# Patient Record
Sex: Female | Born: 1955 | Race: Black or African American | Hispanic: No | Marital: Single | Smoking: Current every day smoker
Health system: Southern US, Community
[De-identification: ages and names within clinical notes are randomized; demographics above are authoritative.]

## PROBLEM LIST (undated history)

## (undated) DIAGNOSIS — K219 Gastro-esophageal reflux disease without esophagitis: Secondary | ICD-10-CM

## (undated) DIAGNOSIS — IMO0001 Reserved for inherently not codable concepts without codable children: Secondary | ICD-10-CM

## (undated) DIAGNOSIS — J449 Chronic obstructive pulmonary disease, unspecified: Secondary | ICD-10-CM

## (undated) DIAGNOSIS — I10 Essential (primary) hypertension: Secondary | ICD-10-CM

## (undated) DIAGNOSIS — R110 Nausea: Secondary | ICD-10-CM

---

## 2004-07-28 ENCOUNTER — Ambulatory Visit: Payer: Self-pay | Admitting: Family Medicine

## 2004-08-11 ENCOUNTER — Ambulatory Visit: Payer: Self-pay | Admitting: Family Medicine

## 2007-11-05 ENCOUNTER — Ambulatory Visit (HOSPITAL_COMMUNITY): Admission: RE | Admit: 2007-11-05 | Discharge: 2007-11-05 | Payer: Self-pay | Admitting: Family Medicine

## 2009-01-04 ENCOUNTER — Ambulatory Visit: Payer: Self-pay | Admitting: Cardiology

## 2011-08-31 DIAGNOSIS — R000 Tachycardia, unspecified: Secondary | ICD-10-CM

## 2011-09-02 DIAGNOSIS — R000 Tachycardia, unspecified: Secondary | ICD-10-CM

## 2013-02-06 ENCOUNTER — Encounter (HOSPITAL_COMMUNITY): Payer: Self-pay | Admitting: Emergency Medicine

## 2013-02-06 ENCOUNTER — Inpatient Hospital Stay (HOSPITAL_COMMUNITY)
Admission: EM | Admit: 2013-02-06 | Discharge: 2013-02-08 | DRG: 684 | Disposition: A | Discharge status: Home / Self Care | Payer: PRIVATE HEALTH INSURANCE | Attending: Family Medicine | Admitting: Family Medicine

## 2013-02-06 DIAGNOSIS — R197 Diarrhea, unspecified: Secondary | ICD-10-CM | POA: Diagnosis present

## 2013-02-06 DIAGNOSIS — E876 Hypokalemia: Secondary | ICD-10-CM | POA: Diagnosis present

## 2013-02-06 DIAGNOSIS — E860 Dehydration: Secondary | ICD-10-CM | POA: Diagnosis present

## 2013-02-06 DIAGNOSIS — I10 Essential (primary) hypertension: Secondary | ICD-10-CM | POA: Diagnosis present

## 2013-02-06 DIAGNOSIS — N179 Acute kidney failure, unspecified: Principal | ICD-10-CM | POA: Diagnosis present

## 2013-02-06 DIAGNOSIS — I498 Other specified cardiac arrhythmias: Secondary | ICD-10-CM | POA: Diagnosis present

## 2013-02-06 DIAGNOSIS — R55 Syncope and collapse: Secondary | ICD-10-CM

## 2013-02-06 DIAGNOSIS — F17200 Nicotine dependence, unspecified, uncomplicated: Secondary | ICD-10-CM | POA: Diagnosis present

## 2013-02-06 DIAGNOSIS — I959 Hypotension, unspecified: Secondary | ICD-10-CM

## 2013-02-06 DIAGNOSIS — K219 Gastro-esophageal reflux disease without esophagitis: Secondary | ICD-10-CM | POA: Diagnosis present

## 2013-02-06 HISTORY — DX: Essential (primary) hypertension: I10

## 2013-02-06 HISTORY — DX: Reserved for inherently not codable concepts without codable children: IMO0001

## 2013-02-06 HISTORY — DX: Gastro-esophageal reflux disease without esophagitis: K21.9

## 2013-02-06 HISTORY — DX: Nausea: R11.0

## 2013-02-06 LAB — CBC WITH DIFFERENTIAL/PLATELET
HCT: 34.6 % — ABNORMAL LOW (ref 36.0–46.0)
Hemoglobin: 11.6 g/dL — ABNORMAL LOW (ref 12.0–15.0)
Lymphs Abs: 1.2 10*3/uL (ref 0.7–4.0)
MCH: 28.6 pg (ref 26.0–34.0)
Monocytes Relative: 12 % (ref 3–12)
Neutro Abs: 3.4 10*3/uL (ref 1.7–7.7)
Neutrophils Relative %: 63 % (ref 43–77)
RBC: 4.06 MIL/uL (ref 3.87–5.11)

## 2013-02-06 LAB — BASIC METABOLIC PANEL
BUN: 50 mg/dL — ABNORMAL HIGH (ref 6–23)
Chloride: 97 mEq/L (ref 96–112)
Glucose, Bld: 124 mg/dL — ABNORMAL HIGH (ref 70–99)
Potassium: 2.3 mEq/L — CL (ref 3.5–5.1)

## 2013-02-06 MED ORDER — POTASSIUM CHLORIDE 10 MEQ/100ML IV SOLN
10.0000 meq | INTRAVENOUS | Status: DC
  Administered 2013-02-06: 10 meq via INTRAVENOUS
  Filled 2013-02-06: qty 100

## 2013-02-06 MED ORDER — FAMOTIDINE IN NACL 20-0.9 MG/50ML-% IV SOLN
20.0000 mg | Freq: Two times a day (BID) | INTRAVENOUS | Status: DC
  Administered 2013-02-06: 20 mg via INTRAVENOUS
  Filled 2013-02-06 (×6): qty 50

## 2013-02-06 MED ORDER — FAMOTIDINE IN NACL 20-0.9 MG/50ML-% IV SOLN
INTRAVENOUS | Status: AC
  Filled 2013-02-06: qty 50

## 2013-02-06 MED ORDER — SODIUM CHLORIDE 0.9 % IV BOLUS (SEPSIS)
2000.0000 mL | Freq: Once | INTRAVENOUS | Status: AC
  Administered 2013-02-06: 2000 mL via INTRAVENOUS

## 2013-02-06 MED ORDER — ONDANSETRON HCL 4 MG/2ML IJ SOLN: 4.0000 mg | Freq: Four times a day (QID) | INTRAMUSCULAR | Status: DC | PRN

## 2013-02-06 MED ORDER — POTASSIUM CHLORIDE 10 MEQ/100ML IV SOLN
10.0000 meq | INTRAVENOUS | Status: AC
  Administered 2013-02-06 – 2013-02-07 (×6): 10 meq via INTRAVENOUS
  Filled 2013-02-06 (×6): qty 100

## 2013-02-06 MED ORDER — INFLUENZA VAC SPLIT QUAD 0.5 ML IM SUSP
0.5000 mL | INTRAMUSCULAR | Status: AC
  Administered 2013-02-07: 0.5 mL via INTRAMUSCULAR
  Filled 2013-02-06: qty 0.5

## 2013-02-06 MED ORDER — PNEUMOCOCCAL VAC POLYVALENT 25 MCG/0.5ML IJ INJ
0.5000 mL | INJECTION | INTRAMUSCULAR | Status: AC
  Administered 2013-02-07: 0.5 mL via INTRAMUSCULAR
  Filled 2013-02-06: qty 0.5

## 2013-02-06 MED ORDER — SODIUM CHLORIDE 0.9 % IJ SOLN
3.0000 mL | Freq: Two times a day (BID) | INTRAMUSCULAR | Status: DC
  Administered 2013-02-06: 3 mL via INTRAVENOUS

## 2013-02-06 MED ORDER — ENOXAPARIN SODIUM 30 MG/0.3ML ~~LOC~~ SOLN
30.0000 mg | SUBCUTANEOUS | Status: DC
  Administered 2013-02-06: 30 mg via SUBCUTANEOUS
  Filled 2013-02-06: qty 0.3

## 2013-02-06 MED ORDER — DIPHENOXYLATE-ATROPINE 2.5-0.025 MG PO TABS
2.0000 | ORAL_TABLET | Freq: Once | ORAL | Status: AC
  Administered 2013-02-06: 2 via ORAL
  Filled 2013-02-06: qty 2

## 2013-02-06 MED ORDER — ACETAMINOPHEN 325 MG PO TABS
650.0000 mg | ORAL_TABLET | Freq: Four times a day (QID) | ORAL | Status: DC | PRN
Start: 2013-02-06 — End: 2013-02-08

## 2013-02-06 MED ORDER — ONDANSETRON HCL 4 MG PO TABS: 4.0000 mg | ORAL_TABLET | Freq: Four times a day (QID) | ORAL | Status: DC | PRN

## 2013-02-06 MED ORDER — SODIUM CHLORIDE 0.9 % IV SOLN
INTRAVENOUS | Status: DC
  Administered 2013-02-06 – 2013-02-07 (×3): via INTRAVENOUS

## 2013-02-06 MED ORDER — ACETAMINOPHEN 650 MG RE SUPP: 650.0000 mg | Freq: Four times a day (QID) | RECTAL | Status: DC | PRN

## 2013-02-06 NOTE — ED Notes (Signed)
Lab called critical K 2.3.  Notified edp

## 2013-02-06 NOTE — ED Notes (Signed)
CBG en route was 111. Pt received zofran 4mg  IV en route.

## 2013-02-06 NOTE — ED Notes (Signed)
Pt passed out while standing in the post office. Sick since Tuesday with diarrhea and decreased appetite.

## 2013-02-06 NOTE — Progress Notes (Signed)
Daphane Shepherd is notified about pt's low HR, mid 40s, SB, BP 90/40. Pt denies any pain or discomfort. Orders are received and followed.

## 2013-02-06 NOTE — ED Provider Notes (Signed)
CSN: [id number]     Arrival date & time 02/06/13  1318 History   First MD Initiated Contact with Patient 02/06/13 1342     Chief Complaint  Patient presents with  . Loss of Consciousness   ) HPI Pt has no PCP.  She gets her BP meds refilled at [facility].  Patient seen at 1348 pm.  She presents via EMS in a cervical collar and long spine board after syncopal episode. She's had diarrhea 7-8 times per day since Tuesday, 2 days before that. She was standing at the post office today. She started feeling weak and dizzy. She got up the counter and was feeling more lightheaded.  Denies vertigo.She slid to the floor.. She did not strike her head that she can recall. She was out only a few seconds. Did not have incontinence. Did not have seizure activity or myoclonus. She remembers feeling like she was going to pass out, and feeling warm. She denies chest pain or palpitations.  No vomiting. No blood in her stools. No recent antibiotic use. She is nondiabetic. She has history of hypertension. She did take her antihypertensives.   Past Medical History  Diagnosis Date  . Hypertension   . Reflux   . Nausea    History reviewed. No pertinent past surgical history. No family history on file. History  Substance Use Topics  . Smoking status: Current Every Day Smoker    Types: Cigarettes  . Smokeless tobacco: Not on file  . Alcohol Use: Yes     Comment: Occ   OB History   Grav Para Term Preterm Abortions TAB SAB Ect Mult Living                 Review of Systems  Constitutional: Negative for fever, chills, diaphoresis, appetite change and fatigue.  HENT: Negative for mouth sores, sore throat and trouble swallowing.   Eyes: Negative for visual disturbance.  Respiratory: Negative for cough, chest tightness, shortness of breath and wheezing.   Cardiovascular: Negative for chest pain.  Gastrointestinal: Positive for diarrhea. Negative for nausea, vomiting, abdominal pain and  abdominal distention.  Endocrine: Negative for polydipsia, polyphagia and polyuria.  Genitourinary: Negative for dysuria, frequency and hematuria.  Musculoskeletal: Negative for gait problem.  Skin: Negative for color change, pallor and rash.  Neurological: Positive for syncope. Negative for dizziness, light-headedness and headaches.  Hematological: Does not bruise/bleed easily.  Psychiatric/Behavioral: Negative for behavioral problems and confusion.    Allergies  Penicillins  Home Medications   Current Outpatient Rx  Name  Route  Sig  Dispense  Refill  . Aspirin-Acetaminophen-Caffeine (GOODY HEADACHE PO)   Oral   Take 1 packet by mouth daily as needed (headache).         . lisinopril-hydrochlorothiazide (PRINZIDE,ZESTORETIC) 20-12.5 MG per tablet   Oral   Take 1 tablet by mouth daily.         . promethazine (PHENERGAN) 25 MG tablet   Oral   Take 12.5-25 mg by mouth as needed for nausea or vomiting (every 4-6 hours as needed.).         Marland Kitchen ranitidine (ZANTAC) 150 MG tablet   Oral   Take 150 mg by mouth 2 (two) times daily.          BP 98/64  Pulse 54  Temp(Src) 97.7 F (36.5 C) (Oral)  Resp 16  Ht 5\' 3"  (1.6 m)  Wt 179 lb (81.194 kg)  BMI 31.72 kg/m2  SpO2 100% Physical Exam  Constitutional: She is oriented to person, place, and time. She appears well-developed and well-nourished. No distress. Cervical collar and backboard in place.  Patient awake alert oriented. Nontender over the scalp and skull. No signs of trauma the head. Nontender the midline cervical spine. Nontender over the thoracic lumbar spine. Clinically cleared from cervical: Spine board upon arrival.  HENT:  Head: Normocephalic.  Eyes: Conjunctivae are normal. Pupils are equal, round, and reactive to light. No scleral icterus.  Neck: Normal range of motion. Neck supple. No thyromegaly present.  Cardiovascular: Normal rate and regular rhythm.  Exam reveals no gallop and no friction rub.   No  murmur heard. Pulmonary/Chest: Effort normal and breath sounds normal. No respiratory distress. She has no wheezes. She has no rales.  Abdominal: Soft. Bowel sounds are normal. She exhibits no distension. There is no tenderness. There is no rebound.  Musculoskeletal: Normal range of motion.  Neurological: She is alert and oriented to person, place, and time.  Skin: Skin is warm and dry. No rash noted.  Psychiatric: She has a normal mood and affect. Her behavior is normal.    ED Course  Procedures (including critical care time) Labs Review Labs Reviewed  CBC WITH DIFFERENTIAL - Abnormal; Notable for the following:    Hemoglobin 11.6 (*)    HCT 34.6 (*)    All other components within normal limits  BASIC METABOLIC PANEL   Imaging Review No results found.  EKG Interpretation     Ventricular Rate:  51 PR Interval:  160 QRS Duration: 104 QT Interval:  486 QTC Calculation: 447 R Axis:   -29 Text Interpretation:  Sinus Rhythm  with sinus arrhythmia No previous ECGs available            MDM   1. Diarrhea   2. Dehydration   3. Hypotension      Medical decision making, ED course:  No headache. Nontender in the head. Able to be clinically cleared from the cervical collar and long spine board upon arrival. No neurological symptoms. She is somewhat hypotensive with pressure of 96 systolic. Plan will be IV fluids will check her electrolytes and CBC. She does not appear anemic. She's not tachycardic. She is awake alert oriented and mentating well.  After first l NS pt c/o felling weak.  SBP still 90s systolic. Starting 2nd L NS.  Unfortunately, Pt took BP meds just before leaving to go to [employer].  Normal Hb.    BP 89/67, starting 2nd l NS.  Labs show K+ of 2.3, Azotemia with Cr. 2.3.  ? Baseline.  No h/o Renal Insufficiency per pt report.  No comparison labs.  Mg level requested.      Roney Marion, MD 02/06/13 [phone]

## 2013-02-06 NOTE — H&P (Signed)
History and Physical  DECHELLE ATTAWAY UJW:[id number] DOB: [birth year] DOA: 02/06/2013  Referring physician: Rolland Porter, MD in ED PCP: Provider Not In System  [facility] free clinic  Chief Complaint: Verna Czech out  HPI:  57 year old woman presented to the emergency department after a syncopal episode. Initial evaluation was notable for hypotension, profound hypokalemia and acute renal failure.  Patient reports diarrhea began suddenly 11/11 with multiple bouts, nonbloody. She also had whole-body muscle aches. She spent most of the day and then following day in bed. She had no nausea, vomiting or abdominal pain. Appetite was poor. No nasal congestion, runny nose, cough or upper respiratory symptoms. She went to the emergency department at [facility] 11/12 but left without being seen. She is seen by her primary care provider the following day without definitive diagnosis. She had severe diarrhea 11/11 and 11/12. Yesterday 11/13 the diarrhea slowed down after she took Imodium and Pepto-Bismol. She has had no diarrhea today. She has had absolutely no nausea, vomiting or abdominal pain. Went to the bank, was standing in line with she became lightheaded, dizzy and passed out. Afterwards she had a mild headache. Currently she has no musculoskeletal complaints.  In the emergency department noted to be afebrile. Hypotensive. Potassium was 2.3. BUN 50, creatinine 2.34. White blood cell count normal. EKG not acute.  Review of Systems:  Negative for fever, visual changes, sore throat, rash, chest pain, SOB, dysuria, bleeding, n/v, abdominal pain.  Positive for chills.  Past Medical History  Diagnosis Date  . Hypertension   . Reflux   . Nausea     History reviewed. No pertinent past surgical history. No previous surgery.  Social History:  reports that she has been smoking Cigarettes.  She has been smoking about 0.00 packs per day. She does not have any smokeless tobacco history on file. She reports that  she drinks alcohol. She reports that she does not use illicit drugs.  Allergies  Allergen Reactions  . Penicillins Hives    Family History  Problem Relation Age of Onset  . Cancer Brother     lung     Prior to Admission medications   Medication Sig Start Date End Date Taking? Authorizing Provider  Aspirin-Acetaminophen-Caffeine (GOODY HEADACHE PO) Take 1 packet by mouth daily as needed (headache).   Yes Historical Provider, MD  lisinopril-hydrochlorothiazide (PRINZIDE,ZESTORETIC) 20-12.5 MG per tablet Take 1 tablet by mouth daily.   Yes Historical Provider, MD  promethazine (PHENERGAN) 25 MG tablet Take 12.5-25 mg by mouth as needed for nausea or vomiting (every 4-6 hours as needed.).   Yes Historical Provider, MD  ranitidine (ZANTAC) 150 MG tablet Take 150 mg by mouth 2 (two) times daily.   Yes Historical Provider, MD   Physical Exam: Filed Vitals:   02/06/13 1322  BP: 98/64  Pulse: 54  Temp: 97.7 F (36.5 C)  TempSrc: Oral  Resp: 16  Height: 5\' 3"  (1.6 m)  Weight: 81.194 kg (179 lb)  SpO2: 100%   General: Examined in the emergency department. Appears calm and comfortable. Appears well. Eyes: PERRL, normal lids, irises & conjunctiva ENT: grossly normal hearing, lips & tongue. Poor dentition with missing teeth. Neck: no LAD, masses or thyromegaly Cardiovascular: RRR, no m/r/g. No LE edema. Respiratory: CTA bilaterally, no w/r/r. Normal respiratory effort. Abdomen: soft, ntnd, positive bowel sounds. No guarding. Skin: no rash or induration seen  Musculoskeletal: grossly normal tone BUE/BLE; moves all extremities well. Psychiatric: grossly normal mood and affect, speech fluent and appropriate Neurologic: grossly  non-focal.  Wt Readings from Last 3 Encounters:  02/06/13 81.194 kg (179 lb)    Labs on Admission:  Basic Metabolic Panel:  Recent Labs Lab 02/06/13 1409  NA 137  K 2.3*  CL 97  CO2 29  GLUCOSE 124*  BUN 50*  CREATININE 2.34*  CALCIUM 8.9    CBC:  Recent Labs Lab 02/06/13 1409  WBC 5.3  NEUTROABS 3.4  HGB 11.6*  HCT 34.6*  MCV 85.2  PLT 186     EKG: Independently reviewed. Sinus rhythm. No acute changes.   Principal Problem:   Hypokalemia Active Problems:   Syncope   Hypotension   Acute renal failure   Hypertension   Assessment/Plan 1. Syncope: Presumably secondary to dehydration, hypokalemia. Monitor on telemetry. 2. Hypotension: history most suggestive of volume loss secondary to diarrhea complicated by continued use of antihypertensives. No signs or symptoms to suggest sepsis. Continue volume repletion. Check lactic acid. Check serum cortisol. 3. Profound hypokalemia: Magnesium normal. Replete aggressively. Presumably secondary to GI losses complicated by hydrochlorothiazide. 4. Acute renal failure: Baseline unknown. Presumed to be secondary to GI losses complicated by lisinopril, hydrochlorothiazide, NSAIDs. 5. Acute diarrheal illness: Abdominal exam benign and no pain. History consistent with viral illness. Last antibiotics several months ago. Check C. difficile. 6. Hypertension  Manual blood pressure in the 80s in the emergency department with the automatic blood pressure cuff during somewhat lower. Patient alert, conversant, nontoxic and appears asymptomatic. History most suggestive of benign etiology, sepsis is doubted. Further evaluation as above. Admit to step down.  Code Status: full code  DVT prophylaxis: Lovenox Family Communication: None present Disposition Plan/Anticipated LOS: admit 2 days  Time spent: 50 minutes  Brendia Sacks, MD  [facility] Pager [phone] 02/06/2013, 3:54 PM

## 2013-02-07 DIAGNOSIS — E876 Hypokalemia: Secondary | ICD-10-CM

## 2013-02-07 LAB — BASIC METABOLIC PANEL
BUN: 31 mg/dL — ABNORMAL HIGH (ref 6–23)
CO2: 25 mEq/L (ref 19–32)
Chloride: 104 mEq/L (ref 96–112)
GFR calc Af Amer: 65 mL/min — ABNORMAL LOW (ref >90)
Glucose, Bld: 109 mg/dL — ABNORMAL HIGH (ref 70–99)
Potassium: 2.9 mEq/L — ABNORMAL LOW (ref 3.5–5.1)
Sodium: 138 mEq/L (ref 135–145)

## 2013-02-07 LAB — CBC
HCT: 32 % — ABNORMAL LOW (ref 36.0–46.0)
Hemoglobin: 10.8 g/dL — ABNORMAL LOW (ref 12.0–15.0)
MCH: 29 pg (ref 26.0–34.0)
MCHC: 33.8 g/dL (ref 30.0–36.0)

## 2013-02-07 LAB — TROPONIN I: Troponin I: <0.3 ng/mL (ref <0.30)

## 2013-02-07 LAB — MAGNESIUM: Magnesium: 2.1 mg/dL (ref 1.5–2.5)

## 2013-02-07 LAB — CORTISOL: Cortisol, Plasma: 7.9 ug/dL

## 2013-02-07 MED ORDER — FAMOTIDINE 20 MG PO TABS
20.0000 mg | ORAL_TABLET | Freq: Two times a day (BID) | ORAL | Status: DC
  Administered 2013-02-07 – 2013-02-08 (×3): 20 mg via ORAL
  Filled 2013-02-07 (×3): qty 1

## 2013-02-07 MED ORDER — POTASSIUM CHLORIDE CRYS ER 20 MEQ PO TBCR
40.0000 meq | EXTENDED_RELEASE_TABLET | Freq: Four times a day (QID) | ORAL | Status: AC
  Administered 2013-02-07 (×4): 40 meq via ORAL
  Filled 2013-02-07 (×4): qty 2

## 2013-02-07 MED ORDER — ENOXAPARIN SODIUM 40 MG/0.4ML ~~LOC~~ SOLN
40.0000 mg | SUBCUTANEOUS | Status: DC
  Administered 2013-02-07: 40 mg via SUBCUTANEOUS
  Filled 2013-02-07: qty 0.4

## 2013-02-07 MED ORDER — SODIUM CHLORIDE 0.9 % IV SOLN
INTRAVENOUS | Status: DC
  Administered 2013-02-07 – 2013-02-08 (×3): via INTRAVENOUS

## 2013-02-07 MED ORDER — SODIUM CHLORIDE 0.9 % IV BOLUS (SEPSIS)
1000.0000 mL | Freq: Once | INTRAVENOUS | Status: AC
  Administered 2013-02-07: 1000 mL via INTRAVENOUS

## 2013-02-07 MED ORDER — POTASSIUM CHLORIDE 10 MEQ/100ML IV SOLN
10.0000 meq | INTRAVENOUS | Status: DC
  Administered 2013-02-07: 10 meq via INTRAVENOUS
  Filled 2013-02-07: qty 100

## 2013-02-07 MED ORDER — LORAZEPAM 1 MG PO TABS
1.0000 mg | ORAL_TABLET | Freq: Once | ORAL | Status: AC
  Administered 2013-02-08: 1 mg via ORAL
  Filled 2013-02-07: qty 1

## 2013-02-07 NOTE — Progress Notes (Signed)
[facility] PROGRESS NOTE  SHAWNI VOLKOV ZOX:[id number] DOB: [birth year] DOA: 02/06/2013 PCP: Provider Not In System [facility] free clinic  Assessment/Plan: 1. Syncope: Secondary to dehydration and profound hypokalemia. 2. Hypotension: Resolved with aggressive IV fluids. Secondary to diarrhea and GI losses. 3. Profound hypokalemia: Improving with repletion. Continue replacement therapy. Magnesium normal. 4. Acute renal failure:resolving rapidly with IV fluids. Adequate urine output. 5. Acute diarrheal illness: Appears to have resolved at this point. 6. Hypertension 7. Chest pain overnight has resolved. EKG unremarkable troponins negative. No further evaluation suggested at this point. Favor atypical etiology. 8. Bradycardia: Sinus. Asymptomatic. Not on any rate control agents. No pauses noted. Suspect physiologic. Monitor clinically.   Advance to a regular diet  Continue potassium replacement, IV fluids.  Basic metabolic panel in the morning  Transfer to medical floor  Anticipate discharge within 48 hours  Pending studies:   none  Code Status: full code DVT prophylaxis: Lovenox Family Communication: none present Disposition Plan: as above  Brendia Sacks, MD  [facility]  Pager [phone] If 7PM-7AM, please contact night-coverage at [email or web address], password [facility] 02/07/2013, 8:05 AM  LOS: 1 day   Summary: 57 year old woman presented to the emergency department after a syncopal episode. Initial evaluation was notable for hypotension, profound hypokalemia and acute renal failure.  Consultants:    Procedures:    Antibiotics:    HPI/Subjective: Noted to have low heart rate and borderline hypotension overnight. Had chest pain last night. Today she feels fine. No complaints. No nausea, vomiting or diarrhea. No abdominal pain. Very hungry.  Objective: Filed Vitals:   02/07/13 0300 02/07/13 0400 02/07/13 0500 02/07/13 0600  BP: 78/45 84/52  84/46 88/46  Pulse:      Temp:  97.6 F (36.4 C)    TempSrc:  Oral    Resp:  10 18   Height:      Weight:   81.738 kg (180 lb 3.2 oz)   SpO2:  95%      Intake/Output Summary (Last 24 hours) at 02/07/13 0805 Last data filed at 02/07/13 0711  Gross per 24 hour  Intake    975 ml  Output   1750 ml  Net   -775 ml     Filed Weights   02/06/13 1322 02/07/13 0500  Weight: 81.194 kg (179 lb) 81.738 kg (180 lb 3.2 oz)    Exam:   Afebrile. Heart rate in the 40s to 50s. Systolic blood pressure 80 to 82N. No hypoxia or tachypnea.  General: Appears calm and comfortable. Sitting on the side of the bed. Well-appearing.  Cardiovascular: Regular rate and rhythm. No murmur, rub or gallop.  Respiratory: Clear to auscultation bilaterally. No wheezes, rales or rhonchi. Normal respiratory effort.  Abdomen: Soft, nontender, nondistended.  Data Reviewed:  Potassium improved, 2.9. Magnesium normal.  Creatinine now normal 1.09. BUN decreased to 31.  Troponins negative x2.  Hemoglobin slightly decreased, 10.8.  Random cortisol 7.9. Lactic acid normal.  EKG marked sinus bradycardia, anteroseptal MI, chronicity unknown. Non specific ST changes.  Scheduled Meds: . enoxaparin (LOVENOX) injection  30 mg Subcutaneous Q24H  . famotidine (PEPCID) IV  20 mg Intravenous Q12H  . influenza vac split quadrivalent PF  0.5 mL Intramuscular Tomorrow-1000  . pneumococcal 23 valent vaccine  0.5 mL Intramuscular Tomorrow-1000  . potassium chloride  10 mEq Intravenous Q1 Hr x 4  . sodium chloride  3 mL Intravenous Q12H   Continuous Infusions: . sodium chloride 150 mL/hr at 02/07/13 0328    Principal  Problem:   Hypokalemia Active Problems:   Syncope   Hypotension   Acute renal failure   Hypertension   Time spent 25 minutes

## 2013-02-07 NOTE — Progress Notes (Signed)
Dr. Onalee Hua is notified about pt's lab results, performed EKG, and low HR in 40s. Orders are received. Pt is currently denies any pain, pt states that "pain comes and goes"

## 2013-02-07 NOTE — Progress Notes (Signed)
Pt complains about chest discomfort, pain 4-5 out of 10. BP 84/52, HR 50. Dr. Onalee Hua is notified and orders received.

## 2013-02-07 NOTE — Progress Notes (Signed)
Dr. Irene Limbo notified that IV access was lost and several unsuccessful attempts were made. Dr. Irene Limbo stated to give PO fluids to hydrate pt.

## 2013-02-07 NOTE — Progress Notes (Signed)
Report called to Sammuel Bailiff, RN and pt going to room 313. Pt transported via w/c with all personal belongings.

## 2013-02-07 NOTE — Progress Notes (Signed)
Nutrition Brief Note  Patient identified on the Malnutrition Screening Tool (MST) Report, yielding a score of 2.  Wt Readings from Last 15 Encounters:  02/07/13 180 lb 3.2 oz (81.738 kg)    Body mass index is 31.93 kg/(m^2). Patient meets criteria for obesity, class I based on current BMI.   Current diet order is regular, patient is consuming approximately 75% of meals at this time. Labs and medications reviewed.   No nutrition interventions warranted at this time. If nutrition issues arise, please consult RD.   Addley Ballinger A. Mayford Knife, RD, LDN Pager: [phone]

## 2013-02-07 NOTE — Progress Notes (Signed)
Pt came up to floor from ICU. Received report from RN. Pt is in NAD will continue to monitor.

## 2013-02-08 DIAGNOSIS — R55 Syncope and collapse: Secondary | ICD-10-CM

## 2013-02-08 LAB — BASIC METABOLIC PANEL
Calcium: 8.9 mg/dL (ref 8.4–10.5)
Creatinine, Ser: 0.77 mg/dL (ref 0.50–1.10)
GFR calc Af Amer: >90 mL/min (ref >90)
GFR calc non Af Amer: >90 mL/min (ref >90)
Glucose, Bld: 122 mg/dL — ABNORMAL HIGH (ref 70–99)
Potassium: 3.6 mEq/L (ref 3.5–5.1)
Sodium: 143 mEq/L (ref 135–145)

## 2013-02-08 NOTE — Discharge Summary (Signed)
Physician Discharge Summary  Marissa Richards AVW:[id number] DOB: 08-31-1955 DOA: 02/06/2013  PCP: Provider Not In System  Admit date: 02/06/2013 Discharge date: 02/08/2013  Recommendations for Outpatient Follow-up:  1. Consider periodic monitoring of creatinine and potassium. 2. Normocytic anemia. Consider repeat CBC in further evaluation as clinically indicated. 3. Asymptomatic sinus bradycardia.  Follow-up Information   Follow up with Provider Not In System In 1 week.     Discharge Diagnoses:  1. Syncope 2. Hypotension secondary to dehydration and diarrhea 3. Profound hypokalemia 4. Acute renal failure 5. Acute diarrheal illness  Discharge Condition: Improved Disposition: Home  Diet recommendation: Heart healthy  Filed Weights   02/06/13 1322 02/07/13 0500  Weight: 81.194 kg (179 lb) 81.738 kg (180 lb 3.2 oz)    History of present illness:  57 year old woman presented to the emergency department after a syncopal episode. Initial evaluation was notable for hypotension, profound hypokalemia and acute renal failure.  Hospital Course:  Marissa Richards was admitted to step down unit. She was treated with aggressive volume resuscitation and potassium replacement. She had no diarrhea or GI symptoms during hospitalization. Blood pressure normalized as did potassium. Her hospitalization was uncomplicated and she is stable for discharge. Individual issues as below.  1. Syncope: Secondary to dehydration and profound hypokalemia. 2. Hypotension: Resolved with aggressive IV fluids. Secondary to diarrhea and GI losses. 3. Profound hypokalemia: Resolved. Secondary to diarrhea. 4. Acute renal failure: Resolved. Secondary to diarrhea. 5. Acute diarrheal illness: Resolved. Likely viral. 6. Hypertension: Stable. 7. Bradycardia: Sinus. Asymptomatic. Not on any rate control agents. No pauses noted. No further evaluation.  Consultants: none Procedures: none Antibiotics: none  Discharge  Instructions  Discharge Orders   Future Orders Complete By Expires   Activity as tolerated - No restrictions  As directed    Diet general  As directed    Discharge instructions  As directed    Comments:     Call your physician or seek immediate medical assistance for generalized weakness, dizziness or passing out or worsening of condition.       Medication List    STOP taking these medications       GOODY HEADACHE PO      TAKE these medications       lisinopril-hydrochlorothiazide 20-12.5 MG per tablet  Commonly known as:  PRINZIDE,ZESTORETIC  Take 1 tablet by mouth daily.     promethazine 25 MG tablet  Commonly known as:  PHENERGAN  Take 12.5-25 mg by mouth as needed for nausea or vomiting (every 4-6 hours as needed.).     ranitidine 150 MG tablet  Commonly known as:  ZANTAC  Take 150 mg by mouth 2 (two) times daily.       Allergies  Allergen Reactions  . Penicillins Hives    The results of significant diagnostics from this hospitalization (including imaging, microbiology, ancillary and laboratory) are listed below for reference.    Significant Diagnostic Studies: No results found.  Microbiology: Recent Results (from the past 240 hour(s))  MRSA PCR SCREENING     Status: None   Collection Time    02/06/13  5:30 PM      Result Value Range Status   MRSA by PCR NEGATIVE  NEGATIVE Final   Comment:            The GeneXpert MRSA Assay (FDA     approved for NASAL specimens     only), is one component of a     comprehensive MRSA colonization  surveillance program. It is not     intended to diagnose MRSA     infection nor to guide or     monitor treatment for     MRSA infections.     Labs: Basic Metabolic Panel:  Recent Labs Lab 02/06/13 1409 02/06/13 2237 02/07/13 0520 02/07/13 0638 02/08/13 0650  NA 137  --  138  --  143  K 2.3* 2.3* 2.9*  --  3.6  CL 97  --  104  --  111  CO2 29  --  25  --  25  GLUCOSE 124*  --  109*  --  122*  BUN 50*  --   31*  --  12  CREATININE 2.34*  --  1.09  --  0.77  CALCIUM 8.9  --  8.4  --  8.9  MG 2.3  --   --  2.1  --    CBC:  Recent Labs Lab 02/06/13 1409 02/07/13 0520  WBC 5.3 4.0  NEUTROABS 3.4  --   HGB 11.6* 10.8*  HCT 34.6* 32.0*  MCV 85.2 85.8  PLT 186 167   Cardiac Enzymes:  Recent Labs Lab 02/06/13 1409 02/07/13 0520  TROPONINI <0.30 <0.30    Principal Problem:   Hypokalemia Active Problems:   Syncope   Hypotension   Acute renal failure   Hypertension   Time coordinating discharge: 25 minutes  Signed:  Brendia Sacks, MD [facility] 02/08/2013, 11:35 AM

## 2013-02-08 NOTE — Progress Notes (Signed)
[facility] PROGRESS NOTE  Marissa Richards ZOX:[id number] DOB: 08-04-1955 DOA: 02/06/2013 PCP: Provider Not In System [facility]  Assessment/Plan: 1. Syncope: Secondary to dehydration and profound hypokalemia. 2. Hypotension: Resolved with aggressive IV fluids. Secondary to diarrhea and GI losses. 3. Profound hypokalemia: Resolved. Secondary to diarrhea. 4. Acute renal failure: Resolved. Secondary to diarrhea. 5. Acute diarrheal illness: Resolved. Likely viral. 6. Hypertension: Stable. 7. Bradycardia: Sinus. Asymptomatic. Not on any rate control agents. No pauses noted. No further evaluation.   Home today  Pending studies:   none  Code Status: full code DVT prophylaxis: Lovenox Family Communication: none present Disposition Plan: as above  Brendia Sacks, MD  [facility]  Pager [phone] If 7PM-7AM, please contact night-coverage at [email or web address], password [facility] 02/08/2013, 11:29 AM  LOS: 2 days   Summary: 57 year old woman presented to the emergency department after a syncopal episode. Initial evaluation was notable for hypotension, profound hypokalemia and acute renal failure.  Consultants:    Procedures:    Antibiotics:    HPI/Subjective: None issues. Feels very good. No nausea, vomiting, abdominal pain or diarrhea.  Objective: Filed Vitals:   02/08/13 0445 02/08/13 0752 02/08/13 0754 02/08/13 0756  BP: 103/63 136/57 121/62 127/61  Pulse: 57 50 56 55  Temp: 98 F (36.7 C) 97.4 F (36.3 C)    TempSrc: Oral Oral    Resp: 18 18 18 18   Height:      Weight:      SpO2: 97% 100% 100% 100%    Intake/Output Summary (Last 24 hours) at 02/08/13 1129 Last data filed at 02/08/13 1030  Gross per 24 hour  Intake   3455 ml  Output   2100 ml  Net   1355 ml     Filed Weights   02/06/13 1322 02/07/13 0500  Weight: 81.194 kg (179 lb) 81.738 kg (180 lb 3.2 oz)    Exam:   Afebrile. Vital signs stable.  Cardiovascular:  Regular rate and rhythm. No murmur, rub or gallop.  Respiratory: Clear to auscultation bilaterally. No wheezes, rales or rhonchi. Normal respiratory effort.  General: Appears calm and comfortable.  Data Reviewed:  Potassium now normal at 3.6.  BUN and creatinine normal  Scheduled Meds: . enoxaparin (LOVENOX) injection  40 mg Subcutaneous Q24H  . famotidine  20 mg Oral BID  . sodium chloride  3 mL Intravenous Q12H   Continuous Infusions: . sodium chloride 150 mL/hr at 02/08/13 0500    Principal Problem:   Hypokalemia Active Problems:   Syncope   Hypotension   Acute renal failure   Hypertension

## 2013-02-08 NOTE — Progress Notes (Signed)
Pt is to be discharged home today. Pt is in NAD, IV is out, all paperwork has been reviewed/discussed with patient, and there are no questions/concerns at this time. Assessment is unchanged from this morning. Pt is to be accompanied downstairs by staff and family via wheelchair.  

## 2013-02-09 NOTE — Progress Notes (Signed)
UR chart review completed.  

## 2013-04-14 ENCOUNTER — Other Ambulatory Visit (HOSPITAL_COMMUNITY): Payer: Self-pay | Admitting: *Deleted

## 2013-04-14 DIAGNOSIS — Z139 Encounter for screening, unspecified: Secondary | ICD-10-CM

## 2013-04-20 ENCOUNTER — Ambulatory Visit (HOSPITAL_COMMUNITY)
Admission: RE | Admit: 2013-04-20 | Discharge: 2013-04-20 | Disposition: A | Discharge status: Home / Self Care | Payer: PRIVATE HEALTH INSURANCE | Source: Ambulatory Visit | Attending: *Deleted | Admitting: *Deleted

## 2013-04-20 DIAGNOSIS — Z139 Encounter for screening, unspecified: Secondary | ICD-10-CM

## 2013-04-20 DIAGNOSIS — Z1231 Encounter for screening mammogram for malignant neoplasm of breast: Secondary | ICD-10-CM | POA: Insufficient documentation

## 2013-12-16 ENCOUNTER — Encounter (HOSPITAL_COMMUNITY): Payer: Self-pay | Admitting: Emergency Medicine

## 2013-12-16 ENCOUNTER — Emergency Department (HOSPITAL_COMMUNITY): Payer: PRIVATE HEALTH INSURANCE

## 2013-12-16 ENCOUNTER — Emergency Department (HOSPITAL_COMMUNITY)
Admission: EM | Admit: 2013-12-16 | Discharge: 2013-12-16 | Disposition: A | Discharge status: Home / Self Care | Payer: PRIVATE HEALTH INSURANCE | Attending: Emergency Medicine | Admitting: Emergency Medicine

## 2013-12-16 DIAGNOSIS — Z79899 Other long term (current) drug therapy: Secondary | ICD-10-CM | POA: Insufficient documentation

## 2013-12-16 DIAGNOSIS — F17200 Nicotine dependence, unspecified, uncomplicated: Secondary | ICD-10-CM | POA: Insufficient documentation

## 2013-12-16 DIAGNOSIS — K219 Gastro-esophageal reflux disease without esophagitis: Secondary | ICD-10-CM | POA: Insufficient documentation

## 2013-12-16 DIAGNOSIS — S46909A Unspecified injury of unspecified muscle, fascia and tendon at shoulder and upper arm level, unspecified arm, initial encounter: Secondary | ICD-10-CM | POA: Insufficient documentation

## 2013-12-16 DIAGNOSIS — Z880 Allergy status to penicillin: Secondary | ICD-10-CM | POA: Insufficient documentation

## 2013-12-16 DIAGNOSIS — M25511 Pain in right shoulder: Secondary | ICD-10-CM

## 2013-12-16 DIAGNOSIS — J4489 Other specified chronic obstructive pulmonary disease: Secondary | ICD-10-CM | POA: Insufficient documentation

## 2013-12-16 DIAGNOSIS — S4980XA Other specified injuries of shoulder and upper arm, unspecified arm, initial encounter: Secondary | ICD-10-CM | POA: Insufficient documentation

## 2013-12-16 DIAGNOSIS — Y9389 Activity, other specified: Secondary | ICD-10-CM | POA: Insufficient documentation

## 2013-12-16 DIAGNOSIS — S298XXA Other specified injuries of thorax, initial encounter: Secondary | ICD-10-CM | POA: Insufficient documentation

## 2013-12-16 DIAGNOSIS — J449 Chronic obstructive pulmonary disease, unspecified: Secondary | ICD-10-CM | POA: Insufficient documentation

## 2013-12-16 DIAGNOSIS — Y92410 Unspecified street and highway as the place of occurrence of the external cause: Secondary | ICD-10-CM | POA: Insufficient documentation

## 2013-12-16 HISTORY — DX: Chronic obstructive pulmonary disease, unspecified: J44.9

## 2013-12-16 MED ORDER — IBUPROFEN 800 MG PO TABS: 800.0000 mg | ORAL_TABLET | Freq: Three times a day (TID) | ORAL | Status: AC

## 2013-12-16 MED ORDER — CYCLOBENZAPRINE HCL 10 MG PO TABS: 10.0000 mg | ORAL_TABLET | Freq: Two times a day (BID) | ORAL | Status: AC | PRN

## 2013-12-16 NOTE — Discharge Instructions (Signed)
Shoulder Pain  The shoulder is the joint that connects your arms to your body. The bones that form the shoulder joint include the upper arm bone (humerus), the shoulder blade (scapula), and the collarbone (clavicle). The top of the humerus is shaped like a ball and fits into a rather flat socket on the scapula (glenoid cavity). A combination of muscles and strong, fibrous tissues that connect muscles to bones (tendons) support your shoulder joint and hold the ball in the socket. Small, fluid-filled sacs (bursae) are located in different areas of the joint. They act as cushions between the bones and the overlying soft tissues and help reduce friction between the gliding tendons and the bone as you move your arm. Your shoulder joint allows a wide range of motion in your arm. This range of motion allows you to do things like scratch your back or throw a ball. However, this range of motion also makes your shoulder more prone to pain from overuse and injury.  Causes of shoulder pain can originate from both injury and overuse and usually can be grouped in the following four categories:   Redness, swelling, and pain (inflammation) of the tendon (tendinitis) or the bursae (bursitis).   Instability, such as a dislocation of the joint.   Inflammation of the joint (arthritis).   Broken bone (fracture).  HOME CARE INSTRUCTIONS    Apply ice to the sore area.   Put ice in a plastic bag.   Place a towel between your skin and the bag.   Leave the ice on for 15-20 minutes, 3-4 times per day for the first 2 days, or as directed by your health care provider.   Stop using cold packs if they do not help with the pain.   If you have a shoulder sling or immobilizer, wear it as long as your caregiver instructs. Only remove it to shower or bathe. Move your arm as little as possible, but keep your hand moving to prevent swelling.   Squeeze a soft ball or foam pad as much as possible to help prevent swelling.   Only take  over-the-counter or prescription medicines for pain, discomfort, or fever as directed by your caregiver.  SEEK MEDICAL CARE IF:    Your shoulder pain increases, or new pain develops in your arm, hand, or fingers.   Your hand or fingers become cold and numb.   Your pain is not relieved with medicines.  SEEK IMMEDIATE MEDICAL CARE IF:    Your arm, hand, or fingers are numb or tingling.   Your arm, hand, or fingers are significantly swollen or turn white or blue.  MAKE SURE YOU:    Understand these instructions.   Will watch your condition.   Will get help right away if you are not doing well or get worse.  Document Released: 12/20/2004 Document Revised: 07/27/2013 Document Reviewed: 02/24/2011  ExitCare Patient Information 2015 [employer]. This information is not intended to replace advice given to you by your health care provider. Make sure you discuss any questions you have with your health care provider.  Cryotherapy  Cryotherapy means treatment with cold. Ice or gel packs can be used to reduce both pain and swelling. Ice is the most helpful within the first 24 to 48 hours after an injury or flare-up from overusing a muscle or joint. Sprains, strains, spasms, burning pain, shooting pain, and aches can all be eased with ice. Ice can also be used when recovering from surgery. Ice is effective, has   very few side effects, and is safe for most people to use.  PRECAUTIONS   Ice is not a safe treatment option for people with:   Raynaud phenomenon. This is a condition affecting small blood vessels in the extremities. Exposure to cold may cause your problems to return.   Cold hypersensitivity. There are many forms of cold hypersensitivity, including:   Cold urticaria. Red, itchy hives appear on the skin when the tissues begin to warm after being iced.   Cold erythema. This is a red, itchy rash caused by exposure to cold.   Cold hemoglobinuria. Red blood cells break down when the tissues begin to warm after  being iced. The hemoglobin that carry oxygen are passed into the urine because they cannot combine with blood proteins fast enough.   Numbness or altered sensitivity in the area being iced.  If you have any of the following conditions, do not use ice until you have discussed cryotherapy with your caregiver:   Heart conditions, such as arrhythmia, angina, or chronic heart disease.   High blood pressure.   Healing wounds or open skin in the area being iced.   Current infections.   Rheumatoid arthritis.   Poor circulation.   Diabetes.  Ice slows the blood flow in the region it is applied. This is beneficial when trying to stop inflamed tissues from spreading irritating chemicals to surrounding tissues. However, if you expose your skin to cold temperatures for too long or without the proper protection, you can damage your skin or nerves. Watch for signs of skin damage due to cold.  HOME CARE INSTRUCTIONS  Follow these tips to use ice and cold packs safely.   Place a dry or damp towel between the ice and skin. A damp towel will cool the skin more quickly, so you may need to shorten the time that the ice is used.   For a more rapid response, add gentle compression to the ice.   Ice for no more than 10 to 20 minutes at a time. The bonier the area you are icing, the less time it will take to get the benefits of ice.   Check your skin after 5 minutes to make sure there are no signs of a poor response to cold or skin damage.   Rest 20 minutes or more between uses.   Once your skin is numb, you can end your treatment. You can test numbness by very lightly touching your skin. The touch should be so light that you do not see the skin dimple from the pressure of your fingertip. When using ice, most people will feel these normal sensations in this order: cold, burning, aching, and numbness.   Do not use ice on someone who cannot communicate their responses to pain, such as small children or people with  dementia.  HOW TO MAKE AN ICE PACK  Ice packs are the most common way to use ice therapy. Other methods include ice massage, ice baths, and cryosprays. Muscle creams that cause a cold, tingly feeling do not offer the same benefits that ice offers and should not be used as a substitute unless recommended by your caregiver.  To make an ice pack, do one of the following:   Place crushed ice or a bag of frozen vegetables in a sealable plastic bag. Squeeze out the excess air. Place this bag inside another plastic bag. Slide the bag into a pillowcase or place a damp towel between your skin and the bag.     Mix 3 parts water with 1 part rubbing alcohol. Freeze the mixture in a sealable plastic bag. When you remove the mixture from the freezer, it will be slushy. Squeeze out the excess air. Place this bag inside another plastic bag. Slide the bag into a pillowcase or place a damp towel between your skin and the bag.  SEEK MEDICAL CARE IF:   You develop white spots on your skin. This may give the skin a blotchy (mottled) appearance.   Your skin turns blue or pale.   Your skin becomes waxy or hard.   Your swelling gets worse.  MAKE SURE YOU:    Understand these instructions.   Will watch your condition.   Will get help right away if you are not doing well or get worse.  Document Released: 11/06/2010 Document Revised: 07/27/2013 Document Reviewed: 11/06/2010  [employer] Patient Information 2015 [employer]. This information is not intended to replace advice given to you by your health care provider. Make sure you discuss any questions you have with your health care provider.
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
## 2013-12-16 NOTE — ED Provider Notes (Signed)
Medical screening examination/treatment/procedure(s) were performed by non-physician practitioner and as supervising physician I was immediately available for consultation/collaboration.   EKG Interpretation None        Jessah Danser L Zohan Shiflet, MD 12/16/13 1546 
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
## 2014-06-24 ENCOUNTER — Other Ambulatory Visit (HOSPITAL_COMMUNITY): Payer: Self-pay | Admitting: *Deleted

## 2014-06-24 DIAGNOSIS — Z1231 Encounter for screening mammogram for malignant neoplasm of breast: Secondary | ICD-10-CM

## 2014-08-04 ENCOUNTER — Ambulatory Visit (HOSPITAL_COMMUNITY)
Admission: RE | Admit: 2014-08-04 | Discharge: 2014-08-04 | Disposition: A | Discharge status: Home / Self Care | Payer: PRIVATE HEALTH INSURANCE | Source: Ambulatory Visit | Attending: *Deleted | Admitting: *Deleted

## 2014-08-04 DIAGNOSIS — Z1231 Encounter for screening mammogram for malignant neoplasm of breast: Secondary | ICD-10-CM | POA: Insufficient documentation

## 2014-11-01 ENCOUNTER — Emergency Department (HOSPITAL_COMMUNITY)
Admission: EM | Admit: 2014-11-01 | Discharge: 2014-11-01 | Disposition: A | Discharge status: Home / Self Care | Payer: PRIVATE HEALTH INSURANCE | Attending: Physician Assistant | Admitting: Physician Assistant

## 2014-11-01 ENCOUNTER — Encounter (HOSPITAL_COMMUNITY): Payer: Self-pay | Admitting: Emergency Medicine

## 2014-11-01 DIAGNOSIS — I10 Essential (primary) hypertension: Secondary | ICD-10-CM | POA: Insufficient documentation

## 2014-11-01 DIAGNOSIS — Z880 Allergy status to penicillin: Secondary | ICD-10-CM | POA: Insufficient documentation

## 2014-11-01 DIAGNOSIS — H538 Other visual disturbances: Secondary | ICD-10-CM | POA: Insufficient documentation

## 2014-11-01 DIAGNOSIS — Z720 Tobacco use: Secondary | ICD-10-CM | POA: Insufficient documentation

## 2014-11-01 DIAGNOSIS — H5712 Ocular pain, left eye: Secondary | ICD-10-CM | POA: Insufficient documentation

## 2014-11-01 DIAGNOSIS — J449 Chronic obstructive pulmonary disease, unspecified: Secondary | ICD-10-CM | POA: Insufficient documentation

## 2014-11-01 DIAGNOSIS — K219 Gastro-esophageal reflux disease without esophagitis: Secondary | ICD-10-CM | POA: Insufficient documentation

## 2014-11-01 DIAGNOSIS — Z79899 Other long term (current) drug therapy: Secondary | ICD-10-CM | POA: Insufficient documentation

## 2014-11-01 MED ORDER — TETRACAINE HCL 0.5 % OP SOLN
1.0000 [drp] | Freq: Once | OPHTHALMIC | Status: AC
  Administered 2014-11-01: 1 [drp] via OPHTHALMIC
  Filled 2014-11-01: qty 2

## 2014-11-01 MED ORDER — ERYTHROMYCIN 5 MG/GM OP OINT
TOPICAL_OINTMENT | Freq: Once | OPHTHALMIC | Status: AC
  Administered 2014-11-01: 1 via OPHTHALMIC
  Filled 2014-11-01: qty 3.5

## 2014-11-01 MED ORDER — FLUORESCEIN SODIUM 1 MG OP STRP
1.0000 | ORAL_STRIP | Freq: Once | OPHTHALMIC | Status: AC
  Administered 2014-11-01: 1 via OPHTHALMIC
  Filled 2014-11-01: qty 1

## 2014-11-01 NOTE — Discharge Instructions (Signed)
Dr. Lita Mains will see you today at 3pm for further evaluation of your eye redness and pain.

## 2014-11-01 NOTE — ED Notes (Signed)
Pt reports left eye drainage,redness since Tuesday. Pt also reports fell yesterday walking to the trash can outside. Pt reports bilateral leg pain ever since. nad noted.

## 2014-11-01 NOTE — ED Notes (Signed)
Pt made aware to return if symptoms worsen or if any life threatening symptoms occur.   

## 2014-11-01 NOTE — ED Provider Notes (Signed)
CSN: [id number]     Arrival date & time 11/01/14  0756 History   First MD Initiated Contact with Patient 11/01/14 0800     Chief Complaint  Patient presents with  . Eye Problem     (Consider location/radiation/quality/duration/timing/severity/associated sxs/prior Treatment) Patient is a 59 y.o. female presenting with eye problem. The history is provided by the patient.  Eye Problem Location:  L eye Quality:  Aching and foreign body sensation Severity:  Moderate Onset quality:  Gradual Duration:  6 days Timing:  Constant Progression:  Worsening Chronicity:  New Relieved by:  Nothing Worsened by:  Bright light Ineffective treatments:  Sleep Associated symptoms: blurred vision, discharge, foreign body sensation, inflammation, itching, photophobia and redness   Associated symptoms: no double vision   Risk factors: conjunctival hemorrhage   Risk factors: not exposed to pinkeye    Marissa Richards is a 59 y.o. female who presents to the ED with left eye redness. Patient reports that when she woke 6 days ago her eye felt like she had something in her eye. She rubbed her eye and then went and got drops to rinse her eye out but it has continued to be red and irritated.   Past Medical History  Diagnosis Date  . Hypertension   . Reflux   . Nausea   . COPD (chronic obstructive pulmonary disease)    History reviewed. No pertinent past surgical history. Family History  Problem Relation Age of Onset  . Cancer Brother     lung   History  Substance Use Topics  . Smoking status: Current Every Day Smoker -- 0.50 packs/day    Types: Cigarettes  . Smokeless tobacco: Not on file  . Alcohol Use: Yes     Comment: Occ   OB History    No data available     Review of Systems  Eyes: Positive for blurred vision, photophobia, pain, discharge, redness, itching and visual disturbance. Negative for double vision.      Allergies  Penicillins  Home Medications   Prior to Admission  medications   Medication Sig Start Date End Date Taking? Authorizing Provider  cyclobenzaprine (FLEXERIL) 10 MG tablet Take 1 tablet (10 mg total) by mouth 2 (two) times daily as needed for muscle spasms. 12/16/13  Yes Shari Upstill, PA-C  ibuprofen (ADVIL,MOTRIN) 800 MG tablet Take 1 tablet (800 mg total) by mouth 3 (three) times daily. Patient taking differently: Take 800 mg by mouth every 8 (eight) hours as needed for moderate pain.  12/16/13  Yes Shari Upstill, PA-C  lisinopril-hydrochlorothiazide (PRINZIDE,ZESTORETIC) 20-12.5 MG per tablet Take 1 tablet by mouth daily.   Yes Historical Provider, MD  loratadine (CLARITIN) 10 MG tablet Take 10 mg by mouth daily as needed for allergies.   Yes Historical Provider, MD  promethazine (PHENERGAN) 25 MG tablet Take 12.5-25 mg by mouth as needed for nausea or vomiting (every 4-6 hours as needed.).   Yes Historical Provider, MD  ranitidine (ZANTAC) 150 MG tablet Take 150 mg by mouth 2 (two) times daily.   Yes Historical Provider, MD   BP 178/78 mmHg  Pulse 64  Temp(Src) 97.8 F (36.6 C) (Oral)  Resp 18  Ht 5\' 2"  (1.575 m)  Wt 169 lb (76.658 kg)  BMI 30.90 kg/m2  SpO2 99% Physical Exam  Constitutional: She is oriented to person, place, and time. She appears well-developed and well-nourished. No distress.  HENT:  Head: Normocephalic.  Eyes: EOM are normal. Pupils are equal, round, and reactive to  light. Lids are everted and swept, no foreign bodies found. Left eye exhibits no exudate and no hordeolum. No foreign body present in the left eye. Left conjunctiva is injected.  Slit lamp exam:      The left eye shows no corneal abrasion, no foreign body and no fluorescein uptake.  Neck: Neck supple.  Cardiovascular: Normal rate.   Pulmonary/Chest: Effort normal.  Musculoskeletal: Normal range of motion.  Neurological: She is alert and oriented to person, place, and time. No cranial nerve deficit.  Skin: Skin is warm and dry.  Psychiatric: She has a  normal mood and affect. Her behavior is normal.  Nursing note and vitals reviewed.   ED Course  Procedures (including critical care time) Tetracaine Opth to left eye, visual acuity, left eye stained, slit lamp exam, exam with woods lamp,  Dr. Corlis Leak in to examine the patient and to check pressure of eyes  Consult with Dr. Lita Mains and he will see the patient in the office today at 3 pm.   Erythromycin Opth. Ointment to left eye prior to d/c.   MDM  59 y.o. female with left eye redness and pain x 1 week. She will go to Dr. Lita Mains office today at 3 pm for further evaluation. Stable to await 3 pm visit. Discussed with the patient and all questioned fully answered. She voices understanding and agrees with plan.   Final diagnoses:  Left eye pain       Janne Napoleon, NP 11/01/14 1023  Courteney Randall An, MD 11/01/14 1605

## 2014-11-01 NOTE — ED Notes (Signed)
Paged Dr. Lita Mains through [facility] Operator at 09:15 to [phone]
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
## 2015-07-11 ENCOUNTER — Ambulatory Visit: Payer: PRIVATE HEALTH INSURANCE | Admitting: Internal Medicine

## 2015-08-04 ENCOUNTER — Encounter: Payer: Self-pay | Admitting: *Deleted

## 2015-08-08 ENCOUNTER — Ambulatory Visit: Payer: PRIVATE HEALTH INSURANCE | Admitting: Internal Medicine

## 2018-06-06 ENCOUNTER — Emergency Department (HOSPITAL_COMMUNITY): Payer: Self-pay

## 2018-06-06 ENCOUNTER — Emergency Department (HOSPITAL_COMMUNITY)
Admission: EM | Admit: 2018-06-06 | Discharge: 2018-06-06 | Disposition: A | Discharge status: Home / Self Care | Payer: Self-pay | Attending: Emergency Medicine | Admitting: Emergency Medicine

## 2018-06-06 ENCOUNTER — Encounter (HOSPITAL_COMMUNITY): Payer: Self-pay | Admitting: Emergency Medicine

## 2018-06-06 ENCOUNTER — Other Ambulatory Visit: Payer: Self-pay

## 2018-06-06 DIAGNOSIS — J069 Acute upper respiratory infection, unspecified: Secondary | ICD-10-CM | POA: Insufficient documentation

## 2018-06-06 DIAGNOSIS — F17210 Nicotine dependence, cigarettes, uncomplicated: Secondary | ICD-10-CM | POA: Insufficient documentation

## 2018-06-06 DIAGNOSIS — J441 Chronic obstructive pulmonary disease with (acute) exacerbation: Secondary | ICD-10-CM | POA: Insufficient documentation

## 2018-06-06 DIAGNOSIS — R0602 Shortness of breath: Secondary | ICD-10-CM | POA: Insufficient documentation

## 2018-06-06 DIAGNOSIS — R51 Headache: Secondary | ICD-10-CM | POA: Insufficient documentation

## 2018-06-06 DIAGNOSIS — Z79899 Other long term (current) drug therapy: Secondary | ICD-10-CM | POA: Insufficient documentation

## 2018-06-06 DIAGNOSIS — I10 Essential (primary) hypertension: Secondary | ICD-10-CM | POA: Insufficient documentation

## 2018-06-06 MED ORDER — IPRATROPIUM-ALBUTEROL 0.5-2.5 (3) MG/3ML IN SOLN
3.0000 mL | Freq: Once | RESPIRATORY_TRACT | Status: AC
  Administered 2018-06-06: 3 mL via RESPIRATORY_TRACT
  Filled 2018-06-06: qty 3

## 2018-06-06 MED ORDER — ALBUTEROL SULFATE HFA 108 (90 BASE) MCG/ACT IN AERS
2.0000 | INHALATION_SPRAY | Freq: Once | RESPIRATORY_TRACT | Status: AC
  Administered 2018-06-06: 2 via RESPIRATORY_TRACT
  Filled 2018-06-06: qty 6.7

## 2018-06-06 MED ORDER — DEXAMETHASONE 4 MG PO TABS: 4.0000 mg | ORAL_TABLET | Freq: Two times a day (BID) | ORAL | 0 refills | Status: DC

## 2018-06-06 MED ORDER — PSEUDOEPHEDRINE HCL 60 MG PO TABS
60.0000 mg | ORAL_TABLET | Freq: Once | ORAL | Status: AC
  Administered 2018-06-06: 60 mg via ORAL
  Filled 2018-06-06: qty 1

## 2018-06-06 MED ORDER — PREDNISONE 20 MG PO TABS
40.0000 mg | ORAL_TABLET | Freq: Once | ORAL | Status: AC
  Administered 2018-06-06: 40 mg via ORAL
  Filled 2018-06-06: qty 2

## 2018-06-06 MED ORDER — AZITHROMYCIN 250 MG PO TABS: ORAL_TABLET | ORAL | 0 refills | Status: DC

## 2018-06-06 NOTE — ED Notes (Signed)
Patient ambulated in hall without difficulty.  O2 sats remained 98% on room air

## 2018-06-06 NOTE — ED Provider Notes (Signed)
[facility] EMERGENCY DEPARTMENT Provider Note   CSN: [id number] Arrival date & time: 06/06/18  4098    History   Chief Complaint Chief Complaint  Patient presents with  . Cough    HPI Marissa Richards is a 63 y.o. female.     The history is provided by the patient.  Cough  Cough characteristics:  Non-productive Sputum characteristics:  White Severity:  Moderate Onset quality:  Gradual Duration:  1 week Timing:  Intermittent Progression:  Worsening Chronicity:  New Smoker: yes   Context: sick contacts and weather changes   Context comment:  No recent travel. No exposure to known Coronona virus patients. Relieved by:  Nothing Worsened by:  Nothing Ineffective treatments:  Cough suppressants Associated symptoms: chills, diaphoresis, headaches, myalgias, shortness of breath, sinus congestion and wheezing   Associated symptoms: no chest pain, no eye discharge and no fever     Past Medical History:  Diagnosis Date  . COPD (chronic obstructive pulmonary disease) (HCC)   . Hypertension   . Nausea   . Reflux     Patient Active Problem List   Diagnosis Date Noted  . Hypokalemia 02/06/2013  . Syncope 02/06/2013  . Hypotension 02/06/2013  . Acute renal failure (HCC) 02/06/2013  . Hypertension 02/06/2013    History reviewed. No pertinent surgical history.   OB History   No obstetric history on file.      Home Medications    Prior to Admission medications   Medication Sig Start Date End Date Taking? Authorizing Provider  cyclobenzaprine (FLEXERIL) 10 MG tablet Take 1 tablet (10 mg total) by mouth 2 (two) times daily as needed for muscle spasms. 12/16/13   Elpidio Anis, PA-C  ibuprofen (ADVIL,MOTRIN) 800 MG tablet Take 1 tablet (800 mg total) by mouth 3 (three) times daily. Patient taking differently: Take 800 mg by mouth every 8 (eight) hours as needed for moderate pain.  12/16/13   Elpidio Anis, PA-C  lisinopril-hydrochlorothiazide (PRINZIDE,ZESTORETIC)  20-12.5 MG per tablet Take 1 tablet by mouth daily.    [provider]  loratadine (CLARITIN) 10 MG tablet Take 10 mg by mouth daily as needed for allergies.    [provider]  promethazine (PHENERGAN) 25 MG tablet Take 12.5-25 mg by mouth as needed for nausea or vomiting (every 4-6 hours as needed.).    [provider]  ranitidine (ZANTAC) 150 MG tablet Take 150 mg by mouth 2 (two) times daily.    [provider]    Family History Family History  Problem Relation Age of Onset  . Hypertension Father   . Cancer Brother        lung  . Hypertension Sister   . Hypertension Brother   . Stroke Maternal Grandmother   . Heart attack Neg Hx     Social History Social History   Tobacco Use  . Smoking status: Current Every Day Smoker    Packs/day: 0.50    Types: Cigarettes  . Smokeless tobacco: Never Used  Substance Use Topics  . Alcohol use: Yes    Comment: Occ  . Drug use: No     Allergies   Penicillins   Review of Systems Review of Systems  Constitutional: Positive for chills and diaphoresis. Negative for activity change and fever.       All ROS Neg except as noted in HPI  HENT: Positive for congestion. Negative for nosebleeds.   Eyes: Negative for photophobia and discharge.  Respiratory: Positive for cough, shortness of breath and  wheezing.   Cardiovascular: Negative for chest pain and palpitations.  Gastrointestinal: Negative for abdominal pain and blood in stool.  Genitourinary: Negative for dysuria, frequency and hematuria.  Musculoskeletal: Positive for myalgias. Negative for arthralgias, back pain and neck pain.  Skin: Negative.   Neurological: Positive for headaches. Negative for dizziness, seizures and speech difficulty.  Psychiatric/Behavioral: Negative for confusion and hallucinations.     Physical Exam Updated Vital Signs BP (!) 166/69   Pulse 66   Temp 98.4 F (36.9 C) (Oral)   Resp 17   Ht 5\' 3"  (1.6 m)   Wt 86.2  kg   SpO2 99%   BMI 33.66 kg/m   Physical Exam Vitals signs and nursing note reviewed.  Constitutional:      Appearance: She is well-developed. She is not toxic-appearing.  HENT:     Head: Normocephalic.     Right Ear: Tympanic membrane and external ear normal.     Left Ear: Tympanic membrane and external ear normal.     Nose: Congestion present.  Eyes:     General: Lids are normal.     Pupils: Pupils are equal, round, and reactive to light.  Neck:     Musculoskeletal: Normal range of motion and neck supple.     Vascular: No carotid bruit.  Cardiovascular:     Rate and Rhythm: Regular rhythm. Bradycardia present.     Pulses: Normal pulses.     Heart sounds: Normal heart sounds.  Pulmonary:     Effort: No respiratory distress.     Breath sounds: Rhonchi present.  Abdominal:     General: Bowel sounds are normal.     Palpations: Abdomen is soft.     Tenderness: There is no abdominal tenderness. There is no guarding.  Musculoskeletal: Normal range of motion.  Lymphadenopathy:     Head:     Right side of head: No submandibular adenopathy.     Left side of head: No submandibular adenopathy.     Cervical: No cervical adenopathy.  Skin:    General: Skin is warm and dry.  Neurological:     Mental Status: She is alert and oriented to person, place, and time.     Cranial Nerves: No cranial nerve deficit.     Sensory: No sensory deficit.  Psychiatric:        Speech: Speech normal.      ED Treatments / Results  Labs (all labs ordered are listed, but only abnormal results are displayed) Labs Reviewed - No data to display  EKG None  Radiology No results found.  Procedures Procedures (including critical care time)  Medications Ordered in ED Medications - No data to display   Initial Impression / Assessment and Plan / ED Course  I have reviewed the triage vital signs and the nursing notes.  Pertinent labs & imaging results that were available during my care of the  patient were reviewed by me and considered in my medical decision making (see chart for details).          Final Clinical Impressions(s) / ED Diagnoses MDM  Blood pressure is elevated.  Vital signs are otherwise within normal limits.  Pulse oximetry is 98% on room air.  Within normal limits by my interpretation.  Patient has a history of chronic obstructive pulmonary disease.  She states that she has had some sweats and weakness.  Will obtain chest x-ray to evaluate for possible pneumonia or other lung related issues.  Patient is being treated with  albuterol and also will start steroid medications.  Chest x-ray is negative for acute problems.  Patient ambulated in the hall.  No evidence of desaturation or other complications.  Patient will be treated with albuterol, Decadron, and Zithromax.  The patient is to follow-up with her primary physician or return to the emergency department if any changes in her condition, worsening of symptoms, problems, or concerns.  Patient is in agreement with this plan.   Final diagnoses:  COPD exacerbation (HCC)  Upper respiratory tract infection, unspecified type    ED Discharge Orders         Ordered    dexamethasone (DECADRON) 4 MG tablet  2 times daily with meals     06/06/18 1126    azithromycin (ZITHROMAX) 250 MG tablet     06/06/18 1126           Ivery Quale, PA-C 06/07/18 7588    Donnetta Hutching, MD 06/07/18 1102

## 2018-06-06 NOTE — Discharge Instructions (Addendum)
Your examination favors an upper respiratory tract infection, and exacerbation of your chronic obstructive pulmonary disease.  Your chest x-ray is negative for pneumonia or acute problems.  Please use 2 puffs of albuterol every 4 hours.  Please use Decadron 2 times daily.  Please use 2 tablets of Zithromax today, then 1 tablet daily until all taken.  Please stop smoking.  Please see your primary physician for follow-up.  Please return to the emergency department if any changes in your condition.

## 2018-06-06 NOTE — ED Triage Notes (Signed)
Cough, sneezing, weakness for approx 1 week

## 2019-01-20 ENCOUNTER — Emergency Department (HOSPITAL_COMMUNITY)
Admission: EM | Admit: 2019-01-20 | Discharge: 2019-01-20 | Disposition: A | Discharge status: Home / Self Care | Payer: Medicaid Other | Attending: Emergency Medicine | Admitting: Emergency Medicine

## 2019-01-20 ENCOUNTER — Other Ambulatory Visit: Payer: Self-pay

## 2019-01-20 ENCOUNTER — Encounter (HOSPITAL_COMMUNITY): Payer: Self-pay | Admitting: Emergency Medicine

## 2019-01-20 ENCOUNTER — Emergency Department (HOSPITAL_COMMUNITY): Payer: Medicaid Other

## 2019-01-20 DIAGNOSIS — Z760 Encounter for issue of repeat prescription: Secondary | ICD-10-CM | POA: Diagnosis not present

## 2019-01-20 DIAGNOSIS — I10 Essential (primary) hypertension: Secondary | ICD-10-CM | POA: Diagnosis not present

## 2019-01-20 DIAGNOSIS — Z79899 Other long term (current) drug therapy: Secondary | ICD-10-CM | POA: Insufficient documentation

## 2019-01-20 DIAGNOSIS — F17210 Nicotine dependence, cigarettes, uncomplicated: Secondary | ICD-10-CM | POA: Insufficient documentation

## 2019-01-20 DIAGNOSIS — J449 Chronic obstructive pulmonary disease, unspecified: Secondary | ICD-10-CM | POA: Diagnosis not present

## 2019-01-20 DIAGNOSIS — M79671 Pain in right foot: Secondary | ICD-10-CM | POA: Insufficient documentation

## 2019-01-20 MED ORDER — TRAMADOL HCL 50 MG PO TABS: 50.0000 mg | ORAL_TABLET | Freq: Four times a day (QID) | ORAL | 0 refills | Status: AC | PRN

## 2019-01-20 MED ORDER — DOXYCYCLINE HYCLATE 100 MG PO CAPS: 100.0000 mg | ORAL_CAPSULE | Freq: Two times a day (BID) | ORAL | 0 refills | Status: DC

## 2019-01-20 MED ORDER — LISINOPRIL-HYDROCHLOROTHIAZIDE 10-12.5 MG PO TABS: 1.0000 | ORAL_TABLET | Freq: Every day | ORAL | 0 refills | Status: AC

## 2019-01-20 NOTE — ED Notes (Signed)
Right foot warm to the touch pulse present in foot.

## 2019-01-20 NOTE — Discharge Instructions (Signed)
Elevate your foot as much as possible.  Take the antibiotic as directed until it is finished.  Your blood pressure today is elevated, you will need to start taking your blood pressure medication again.  I have prescribed a refill for you, but you will need to establish care with one of the clinics listed above.  Return to the ER for any worsening symptoms such as increasing pain redness or swelling of your foot.  Avoid wearing tight fitting shoes.

## 2019-01-20 NOTE — ED Triage Notes (Signed)
Patient states swelling to right foot started Sunday, unsure of when redness started.  Patient has been applying a cold zone pain relieving gel to the foot with no relief

## 2019-01-20 NOTE — ED Notes (Signed)
X-ray in for portable foot

## 2019-01-21 NOTE — ED Provider Notes (Signed)
[facility] EMERGENCY DEPARTMENT Provider Note   CSN: [id number] Arrival date & time: 01/20/19  0935     History   Chief Complaint No chief complaint on file.   HPI Marissa Richards is a 63 y.o. female.     HPI  Marissa Richards is a 63 y.o. female who presents to the Emergency Department complaining of pain and swelling to the top of her right foot.  Symptoms have been present for two days.  She describes a throbbing pain to her foot that's worse with weight bearing.  She noticed redness to the top of her foot that began on the morning of arrival.  Denies numbness of the foot, rash or lesions, and pain to her ankle or toes.  She has been applying OTC muscle rub without relief.  No known injury   Past Medical History:  Diagnosis Date  . COPD (chronic obstructive pulmonary disease) (HCC)   . Hypertension   . Nausea   . Reflux     Patient Active Problem List   Diagnosis Date Noted  . Hypokalemia 02/06/2013  . Syncope 02/06/2013  . Hypotension 02/06/2013  . Acute renal failure (HCC) 02/06/2013  . Hypertension 02/06/2013    History reviewed. No pertinent surgical history.   OB History   No obstetric history on file.      Home Medications    Prior to Admission medications   Medication Sig Start Date End Date Taking? Authorizing Provider  azithromycin (ZITHROMAX) 250 MG tablet 2 tabs today, then one tab daily. 06/06/18   Ivery Quale, PA-C  cyclobenzaprine (FLEXERIL) 10 MG tablet Take 1 tablet (10 mg total) by mouth 2 (two) times daily as needed for muscle spasms. 12/16/13   Elpidio Anis, PA-C  dexamethasone (DECADRON) 4 MG tablet Take 1 tablet (4 mg total) by mouth 2 (two) times daily with a meal. 06/06/18   Ivery Quale, PA-C  doxycycline (VIBRAMYCIN) 100 MG capsule Take 1 capsule (100 mg total) by mouth 2 (two) times daily. 01/20/19   Rosina Cressler, PA-C  ibuprofen (ADVIL,MOTRIN) 800 MG tablet Take 1 tablet (800 mg total) by mouth 3 (three) times daily.  Patient taking differently: Take 800 mg by mouth every 8 (eight) hours as needed for moderate pain.  12/16/13   Elpidio Anis, PA-C  lisinopril-hydrochlorothiazide (ZESTORETIC) 10-12.5 MG tablet Take 1 tablet by mouth daily. 01/20/19   Darshawn Boateng, PA-C  loratadine (CLARITIN) 10 MG tablet Take 10 mg by mouth daily as needed for allergies.    [provider]  promethazine (PHENERGAN) 25 MG tablet Take 12.5-25 mg by mouth as needed for nausea or vomiting (every 4-6 hours as needed.).    [provider]  ranitidine (ZANTAC) 150 MG tablet Take 150 mg by mouth 2 (two) times daily.    [provider]  traMADol (ULTRAM) 50 MG tablet Take 1 tablet (50 mg total) by mouth every 6 (six) hours as needed. 01/20/19   Pauline Aus, PA-C    Family History Family History  Problem Relation Age of Onset  . Hypertension Father   . Cancer Brother        lung  . Hypertension Sister   . Hypertension Brother   . Stroke Maternal Grandmother   . Heart attack Neg Hx     Social History Social History   Tobacco Use  . Smoking status: Current Every Day Smoker    Packs/day: 0.50    Types: Cigarettes  . Smokeless tobacco: Never Used  Substance Use  Topics  . Alcohol use: Yes    Comment: Occ  . Drug use: No     Allergies   Penicillins   Review of Systems Review of Systems  Constitutional: Negative for chills and fever.  Respiratory: Negative for shortness of breath.   Cardiovascular: Negative for chest pain.  Genitourinary: Negative for difficulty urinating and dysuria.  Musculoskeletal: Positive for arthralgias (right foot pain and swelling). Negative for joint swelling.  Skin: Positive for color change (redness of the right foot). Negative for rash and wound.  Neurological: Negative for weakness and numbness.     Physical Exam Updated Vital Signs BP (!) 177/61   Pulse (!) 50   Temp 97.9 F (36.6 C) (Oral)   Resp 20   Ht 5\' 4"  (1.626 m)   Wt 81.6 kg    SpO2 100%   BMI 30.90 kg/m   Physical Exam Vitals signs and nursing note reviewed.  Constitutional:      Appearance: Normal appearance. She is not ill-appearing.  Cardiovascular:     Rate and Rhythm: Normal rate and regular rhythm.     Pulses: Normal pulses.  Pulmonary:     Effort: Pulmonary effort is normal.     Breath sounds: Normal breath sounds.  Musculoskeletal:        General: Swelling and tenderness present. No signs of injury.     Comments: ttp of the dorsal right foot, mild edema noted.  Ankle non-tender.  No tenderness or edema of the toes.    Skin:    General: Skin is warm.     Capillary Refill: Capillary refill takes less than 2 seconds.     Findings: Erythema present. No bruising.     Comments: Focal mild erythema of the dorsolateral right foot.  No rash or open wounds on exam.  Web spaces of the toes are nml appearing.  No lymphangitis  Neurological:     General: No focal deficit present.     Mental Status: She is alert.      ED Treatments / Results  Labs (all labs ordered are listed, but only abnormal results are displayed) Labs Reviewed - No data to display  EKG None  Radiology Dg Foot Complete Right  Result Date: 01/20/2019 CLINICAL DATA:  Swelling and pain to right foot since Sunday. EXAM: RIGHT FOOT COMPLETE - 3+ VIEW COMPARISON:  None FINDINGS: Soft tissue swelling about the right foot. No signs of acute bone finding. Mild calcaneal enthesopathy. Mild hallux valgus. IMPRESSION: Soft tissue swelling with no underlying bony abnormality. Mild calcaneal enthesopathy. Electronically Signed   By: Zetta Bills M.D.   On: 01/20/2019 11:41    Procedures Procedures (including critical care time)  Medications Ordered in ED Medications - No data to display   Initial Impression / Assessment and Plan / ED Course  I have reviewed the triage vital signs and the nursing notes.  Pertinent labs & imaging results that were available during my care of the  patient were reviewed by me and considered in my medical decision making (see chart for details).        Pt with sx's of the right foot localized to the dorsolateral foot.  No known injury.  NV intact.  Likely early cellulitis.  Will tx with abx and she agrees to warm soaks and elevation.  Advised to f/u with PCP or return here in 2 days for recheck.   Of note, pt also hypertensive.  She has been without her medication for "a year  or so"  Denies symptoms.  She was taking Zestoretic.  Will provide a refill and referral info for the local clinic.  Pt agrees to arrange f/u   Final Clinical Impressions(s) / ED Diagnoses   Final diagnoses:  Right foot pain  Hypertension, unspecified type  Medication refill    ED Discharge Orders         Ordered    doxycycline (VIBRAMYCIN) 100 MG capsule  2 times daily     01/20/19 1226    traMADol (ULTRAM) 50 MG tablet  Every 6 hours PRN     01/20/19 1226    lisinopril-hydrochlorothiazide (ZESTORETIC) 10-12.5 MG tablet  Daily     01/20/19 [address]1226           Bryna Razavi, [place], PA-C 01/21/19 2235    Donnetta Hutchingook, Brian, MD 01/22/19 1017

## 2019-11-11 ENCOUNTER — Other Ambulatory Visit (HOSPITAL_COMMUNITY): Payer: Self-pay | Admitting: Physician Assistant

## 2019-11-11 DIAGNOSIS — Z1231 Encounter for screening mammogram for malignant neoplasm of breast: Secondary | ICD-10-CM

## 2019-11-16 ENCOUNTER — Ambulatory Visit (HOSPITAL_COMMUNITY): Payer: Medicaid Other

## 2019-11-17 ENCOUNTER — Ambulatory Visit: Payer: Self-pay | Admitting: Internal Medicine

## 2019-11-23 ENCOUNTER — Ambulatory Visit (HOSPITAL_COMMUNITY): Payer: Medicaid Other

## 2019-11-23 ENCOUNTER — Other Ambulatory Visit: Payer: Self-pay

## 2019-11-23 ENCOUNTER — Ambulatory Visit (HOSPITAL_COMMUNITY)
Admission: RE | Admit: 2019-11-23 | Discharge: 2019-11-23 | Disposition: A | Discharge status: Home / Self Care | Payer: Medicaid Other | Source: Ambulatory Visit | Attending: Physician Assistant | Admitting: Physician Assistant

## 2019-11-23 DIAGNOSIS — Z1231 Encounter for screening mammogram for malignant neoplasm of breast: Secondary | ICD-10-CM | POA: Insufficient documentation

## 2020-01-21 ENCOUNTER — Ambulatory Visit (HOSPITAL_COMMUNITY)
Admission: RE | Admit: 2020-01-21 | Discharge: 2020-01-21 | Disposition: A | Discharge status: Home / Self Care | Payer: Medicaid Other | Source: Ambulatory Visit | Attending: Physician Assistant | Admitting: Physician Assistant

## 2020-01-21 ENCOUNTER — Other Ambulatory Visit: Payer: Self-pay

## 2020-01-21 ENCOUNTER — Other Ambulatory Visit (HOSPITAL_COMMUNITY): Payer: Self-pay | Admitting: Physician Assistant

## 2020-01-21 DIAGNOSIS — M79672 Pain in left foot: Secondary | ICD-10-CM | POA: Insufficient documentation

## 2020-01-21 DIAGNOSIS — M79671 Pain in right foot: Secondary | ICD-10-CM | POA: Insufficient documentation

## 2020-04-06 ENCOUNTER — Emergency Department (HOSPITAL_COMMUNITY)
Admission: EM | Admit: 2020-04-06 | Discharge: 2020-04-07 | Disposition: A | Discharge status: Home / Self Care | Payer: Medicaid Other | Attending: Emergency Medicine | Admitting: Emergency Medicine

## 2020-04-06 ENCOUNTER — Other Ambulatory Visit: Payer: Self-pay

## 2020-04-06 ENCOUNTER — Encounter (HOSPITAL_COMMUNITY): Payer: Self-pay

## 2020-04-06 DIAGNOSIS — U071 COVID-19: Secondary | ICD-10-CM | POA: Diagnosis not present

## 2020-04-06 DIAGNOSIS — I10 Essential (primary) hypertension: Secondary | ICD-10-CM | POA: Diagnosis not present

## 2020-04-06 DIAGNOSIS — J449 Chronic obstructive pulmonary disease, unspecified: Secondary | ICD-10-CM | POA: Insufficient documentation

## 2020-04-06 DIAGNOSIS — E876 Hypokalemia: Secondary | ICD-10-CM | POA: Diagnosis not present

## 2020-04-06 DIAGNOSIS — E860 Dehydration: Secondary | ICD-10-CM | POA: Diagnosis not present

## 2020-04-06 DIAGNOSIS — F17210 Nicotine dependence, cigarettes, uncomplicated: Secondary | ICD-10-CM | POA: Insufficient documentation

## 2020-04-06 DIAGNOSIS — R531 Weakness: Secondary | ICD-10-CM | POA: Diagnosis present

## 2020-04-06 DIAGNOSIS — Z79899 Other long term (current) drug therapy: Secondary | ICD-10-CM | POA: Insufficient documentation

## 2020-04-06 NOTE — ED Triage Notes (Signed)
Pt pov from home with cc of weakness from not having an appetite x 1 week. Has not called pcp.

## 2020-04-07 ENCOUNTER — Other Ambulatory Visit: Payer: Self-pay

## 2020-04-07 LAB — COMPREHENSIVE METABOLIC PANEL
ALT: 18 U/L (ref 0–44)
AST: 13 U/L — ABNORMAL LOW (ref 15–41)
Albumin: 3.6 g/dL (ref 3.5–5.0)
Alkaline Phosphatase: 51 U/L (ref 38–126)
Anion gap: 11 (ref 5–15)
BUN: 28 mg/dL — ABNORMAL HIGH (ref 8–23)
CO2: 22 mmol/L (ref 22–32)
Calcium: 8.9 mg/dL (ref 8.9–10.3)
Chloride: 105 mmol/L (ref 98–111)
Creatinine, Ser: 1.5 mg/dL — ABNORMAL HIGH (ref 0.44–1.00)
GFR, Estimated: 39 mL/min — ABNORMAL LOW (ref >60)
Glucose, Bld: 121 mg/dL — ABNORMAL HIGH (ref 70–99)
Potassium: 3.1 mmol/L — ABNORMAL LOW (ref 3.5–5.1)
Sodium: 138 mmol/L (ref 135–145)
Total Bilirubin: 0.4 mg/dL (ref 0.3–1.2)
Total Protein: 6.9 g/dL (ref 6.5–8.1)

## 2020-04-07 LAB — URINALYSIS, ROUTINE W REFLEX MICROSCOPIC
Bilirubin Urine: NEGATIVE
Glucose, UA: NEGATIVE mg/dL
Ketones, ur: NEGATIVE mg/dL
Leukocytes,Ua: NEGATIVE
Nitrite: NEGATIVE
Protein, ur: NEGATIVE mg/dL
Specific Gravity, Urine: 1.005 (ref 1.005–1.030)
pH: 5 (ref 5.0–8.0)

## 2020-04-07 LAB — CBC WITH DIFFERENTIAL/PLATELET
Abs Immature Granulocytes: 0.17 10*3/uL — ABNORMAL HIGH (ref 0.00–0.07)
Basophils Absolute: 0.1 10*3/uL (ref 0.0–0.1)
Basophils Relative: 1 %
Eosinophils Absolute: 0.2 10*3/uL (ref 0.0–0.5)
Eosinophils Relative: 3 %
HCT: 36.1 % (ref 36.0–46.0)
Hemoglobin: 11.5 g/dL — ABNORMAL LOW (ref 12.0–15.0)
Immature Granulocytes: 3 %
Lymphocytes Relative: 38 %
Lymphs Abs: 2.2 10*3/uL (ref 0.7–4.0)
MCH: 29 pg (ref 26.0–34.0)
MCHC: 31.9 g/dL (ref 30.0–36.0)
MCV: 90.9 fL (ref 80.0–100.0)
Monocytes Absolute: 0.4 10*3/uL (ref 0.1–1.0)
Monocytes Relative: 7 %
Neutro Abs: 2.8 10*3/uL (ref 1.7–7.7)
Neutrophils Relative %: 48 %
Platelets: 294 10*3/uL (ref 150–400)
RBC: 3.97 MIL/uL (ref 3.87–5.11)
RDW: 13.9 % (ref 11.5–15.5)
WBC: 5.9 10*3/uL (ref 4.0–10.5)
nRBC: 0 % (ref 0.0–0.2)

## 2020-04-07 LAB — SARS CORONAVIRUS 2 (TAT 6-24 HRS): SARS Coronavirus 2: POSITIVE — AB

## 2020-04-07 MED ORDER — SODIUM CHLORIDE 0.9 % IV BOLUS
500.0000 mL | Freq: Once | INTRAVENOUS | Status: AC
  Administered 2020-04-07: 500 mL via INTRAVENOUS

## 2020-04-07 MED ORDER — POTASSIUM CHLORIDE ER 10 MEQ PO TBCR: 20.0000 meq | EXTENDED_RELEASE_TABLET | Freq: Every day | ORAL | 0 refills | Status: AC

## 2020-04-07 MED ORDER — POTASSIUM CHLORIDE CRYS ER 20 MEQ PO TBCR
40.0000 meq | EXTENDED_RELEASE_TABLET | Freq: Once | ORAL | Status: AC
  Administered 2020-04-07: 40 meq via ORAL
  Filled 2020-04-07: qty 2

## 2020-04-07 NOTE — ED Provider Notes (Addendum)
[facility] EMERGENCY DEPARTMENT Provider Note   CSN: [id number] Arrival date & time: 04/06/20  2056     History Chief Complaint  Patient presents with  . Weakness    Marissa Richards is a 65 y.o. female.  Patient presents to the emergency department with complaints of generalized weakness.  She reports that symptoms for started about 2 weeks ago.  After a week she started feeling better but then again over this past week she has been experiencing increasing weakness.  She reports poor appetite.  No nausea or vomiting but has had diarrhea.  Patient reports that she felt like this many years ago and was treated for dehydration.  She is not experiencing any chest pain, shortness of breath, heart palpitations, abdominal pain.  Patient denies urinary symptoms, fever, cough and congestion.        Past Medical History:  Diagnosis Date  . COPD (chronic obstructive pulmonary disease) (HCC)   . Hypertension   . Nausea   . Reflux     Patient Active Problem List   Diagnosis Date Noted  . Hypokalemia 02/06/2013  . Syncope 02/06/2013  . Hypotension 02/06/2013  . Acute renal failure (HCC) 02/06/2013  . Hypertension 02/06/2013    History reviewed. No pertinent surgical history.   OB History   No obstetric history on file.     Family History  Problem Relation Age of Onset  . Hypertension Father   . Cancer Brother        lung  . Hypertension Sister   . Hypertension Brother   . Stroke Maternal Grandmother   . Heart attack Neg Hx     Social History   Tobacco Use  . Smoking status: Current Every Day Smoker    Packs/day: 0.50    Types: Cigarettes  . Smokeless tobacco: Never Used  Substance Use Topics  . Alcohol use: Yes    Comment: Occ  . Drug use: No    Home Medications Prior to Admission medications   Medication Sig Start Date End Date Taking? Authorizing Provider  potassium chloride (KLOR-CON) 10 MEQ tablet Take 2 tablets (20 mEq total) by mouth daily. 04/07/20   Yes Tonna Palazzi, Canary Brim, MD  azithromycin (ZITHROMAX) 250 MG tablet 2 tabs today, then one tab daily. 06/06/18   Ivery Quale, PA-C  cyclobenzaprine (FLEXERIL) 10 MG tablet Take 1 tablet (10 mg total) by mouth 2 (two) times daily as needed for muscle spasms. 12/16/13   Elpidio Anis, PA-C  dexamethasone (DECADRON) 4 MG tablet Take 1 tablet (4 mg total) by mouth 2 (two) times daily with a meal. 06/06/18   Ivery Quale, PA-C  doxycycline (VIBRAMYCIN) 100 MG capsule Take 1 capsule (100 mg total) by mouth 2 (two) times daily. 01/20/19   Triplett, Tammy, PA-C  ibuprofen (ADVIL,MOTRIN) 800 MG tablet Take 1 tablet (800 mg total) by mouth 3 (three) times daily. Patient taking differently: Take 800 mg by mouth every 8 (eight) hours as needed for moderate pain.  12/16/13   Elpidio Anis, PA-C  lisinopril-hydrochlorothiazide (ZESTORETIC) 10-12.5 MG tablet Take 1 tablet by mouth daily. 01/20/19   Triplett, Tammy, PA-C  loratadine (CLARITIN) 10 MG tablet Take 10 mg by mouth daily as needed for allergies.    [provider]  promethazine (PHENERGAN) 25 MG tablet Take 12.5-25 mg by mouth as needed for nausea or vomiting (every 4-6 hours as needed.).    [provider]  ranitidine (ZANTAC) 150 MG tablet Take 150 mg by mouth 2 (two) times  daily.    [provider]  traMADol (ULTRAM) 50 MG tablet Take 1 tablet (50 mg total) by mouth every 6 (six) hours as needed. 01/20/19   Triplett, Tammy, PA-C    Allergies    Penicillins  Review of Systems   Review of Systems  Constitutional: Positive for appetite change and fatigue.  All other systems reviewed and are negative.   Physical Exam Updated Vital Signs BP 123/63 (BP Location: Right Arm)   Pulse (!) 58   Temp 98.1 F (36.7 C) (Oral)   Resp 18   Ht 5\' 4"  (1.626 m)   Wt 81.6 kg   SpO2 99%   BMI 30.90 kg/m   Physical Exam Vitals and nursing note reviewed.  Constitutional:      General: She is not in acute distress.     Appearance: Normal appearance. She is well-developed and well-nourished.  HENT:     Head: Normocephalic and atraumatic.     Right Ear: Hearing normal.     Left Ear: Hearing normal.     Nose: Nose normal.     Mouth/Throat:     Mouth: Oropharynx is clear and moist and mucous membranes are normal.  Eyes:     Extraocular Movements: EOM normal.     Conjunctiva/sclera: Conjunctivae normal.     Pupils: Pupils are equal, round, and reactive to light.  Cardiovascular:     Rate and Rhythm: Regular rhythm.     Heart sounds: S1 normal and S2 normal. No murmur heard. No friction rub. No gallop.   Pulmonary:     Effort: Pulmonary effort is normal. No respiratory distress.     Breath sounds: Normal breath sounds.  Chest:     Chest wall: No tenderness.  Abdominal:     General: Bowel sounds are normal.     Palpations: Abdomen is soft. There is no hepatosplenomegaly.     Tenderness: There is no abdominal tenderness. There is no guarding or rebound. Negative signs include Murphy's sign and McBurney's sign.     Hernia: No hernia is present.  Musculoskeletal:        General: Normal range of motion.     Cervical back: Normal range of motion and neck supple.  Skin:    General: Skin is warm, dry and intact.     Findings: No rash.     Nails: There is no cyanosis.  Neurological:     Mental Status: She is alert and oriented to person, place, and time.     GCS: GCS eye subscore is 4. GCS verbal subscore is 5. GCS motor subscore is 6.     Cranial Nerves: No cranial nerve deficit.     Sensory: No sensory deficit.     Coordination: Coordination normal.     Deep Tendon Reflexes: Strength normal.  Psychiatric:        Mood and Affect: Mood and affect normal.        Speech: Speech normal.        Behavior: Behavior normal.        Thought Content: Thought content normal.     ED Results / Procedures / Treatments   Labs (all labs ordered are listed, but only abnormal results are displayed) Labs Reviewed   CBC WITH DIFFERENTIAL/PLATELET - Abnormal; Notable for the following components:      Result Value   Hemoglobin 11.5 (*)    Abs Immature Granulocytes 0.17 (*)    All other components within normal limits  COMPREHENSIVE METABOLIC  PANEL - Abnormal; Notable for the following components:   Potassium 3.1 (*)    Glucose, Bld 121 (*)    BUN 28 (*)    Creatinine, Ser 1.50 (*)    AST 13 (*)    GFR, Estimated 39 (*)    All other components within normal limits  URINALYSIS, ROUTINE W REFLEX MICROSCOPIC    EKG None ED ECG REPORT   Date: 04/07/2020  Rate: 63  Rhythm: normal sinus rhythm  QRS Axis: left  Intervals: normal  ST/T Wave abnormalities: normal  Conduction Disutrbances:left bundle branch block  Narrative Interpretation:   Old EKG Reviewed: unchanged  I have personally reviewed the EKG tracing and agree with the computerized printout as noted.  Radiology No results found.  Procedures Procedures (including critical care time)  Medications Ordered in ED Medications  potassium chloride SA (KLOR-CON) CR tablet 40 mEq (has no administration in time range)  sodium chloride 0.9 % bolus 500 mL (500 mLs Intravenous New Bag/Given 04/07/20 0057)    ED Course  I have reviewed the triage vital signs and the nursing notes.  Pertinent labs & imaging results that were available during my care of the patient were reviewed by me and considered in my medical decision making (see chart for details).    MDM Rules/Calculators/A&P                          Patient presents to the emergency department with generalized weakness.  She does report that she has had some diarrhea.  Patient is afebrile at arrival.  Vital signs are unremarkable.  Abdominal exam is benign, nontender.  No ongoing abdominal pain.  She has a normal white blood cell count of 5.9.  Patient does have elevated BUN and creatinine consistent with acute dehydration.  Patient administered IV fluids.  Additionally she has a  potassium of 3.1, repletion initiated in the department.  Patient's work-up otherwise unremarkable.  She will be appropriate for discharge with continued oral hydration and potassium therapy.  Follow-up with PCP.  Final Clinical Impression(s) / ED Diagnoses Final diagnoses:  Dehydration  Hypokalemia    Rx / DC Orders ED Discharge Orders         Ordered    potassium chloride (KLOR-CON) 10 MEQ tablet  Daily        04/07/20 0159           Gilda Crease, MD 04/07/20 0159    Gilda Crease, MD 04/07/20 [phone]

## 2020-04-08 ENCOUNTER — Telehealth: Payer: Self-pay | Admitting: Nurse Practitioner

## 2020-04-08 NOTE — Telephone Encounter (Signed)
Called to discuss with patient about COVID-19 symptoms and the use of one of the available treatments for those with mild to moderate Covid symptoms and at a high risk of hospitalization.  Pt appears to qualify for outpatient treatment due to co-morbid conditions and/or a member of an at-risk group in accordance with the FDA Emergency Use Authorization.    Symptom onset: 2 weeks ago Qualifiers: Does not qualify due to outside 7 day window for treatment  Gave phone number for post Covid care clinic.   Marissa Richards

## 2020-04-11 ENCOUNTER — Telehealth (INDEPENDENT_AMBULATORY_CARE_PROVIDER_SITE_OTHER): Payer: Medicaid Other | Admitting: Nurse Practitioner

## 2020-04-11 ENCOUNTER — Other Ambulatory Visit: Payer: Self-pay

## 2020-04-11 DIAGNOSIS — U071 COVID-19: Secondary | ICD-10-CM | POA: Diagnosis not present

## 2020-04-11 NOTE — Progress Notes (Unsigned)
Virtual Visit via Telephone Note  I connected with Marissa Richards on 04/12/20 at  2:30 PM EST by telephone and verified that I am speaking with the correct person using two identifiers.  Location: Patient: home Provider: remote   I discussed the limitations, risks, security and privacy concerns of performing an evaluation and management service by telephone and the availability of in person appointments. I also discussed with the patient that there may be a patient responsible charge related to this service. The patient expressed understanding and agreed to proceed.   History of Present Illness:  Patient presents today for televisit/ED follow-up.  Patient was seen in the ED on 04/06/2020 for dehydration and COVID.  Patient was prescribed potassium which she has not been able to get due to weather conditions.  She states that she has been able to stay well-hydrated and is feeling better.  She states that her appetite is coming back.  She denies any significant cough or shortness of breath. Denies f/c/s, n/v/d, hemoptysis, PND, chest pain or edema.     Observations/Objective:  Vitals with BMI 04/07/2020 04/07/2020 04/07/2020  Height - - -  Weight - - -  BMI - - -  Systolic 127 128 884  Diastolic 52 53 63  Pulse 66 54 58      Assessment and Plan:  Covid 19 Dehydration:   Stay well hydrated  Stay active  Deep breathing exercises  May take tylenol for fever or pain   Follow Up Instructions:  Follow up if needed     I discussed the assessment and treatment plan with the patient. The patient was provided an opportunity to ask questions and all were answered. The patient agreed with the plan and demonstrated an understanding of the instructions.   The patient was advised to call back or seek an in-person evaluation if the symptoms worsen or if the condition fails to improve as anticipated.  I provided 22 minutes of non-face-to-face time during this encounter.   Ivonne Andrew, NP

## 2020-04-12 DIAGNOSIS — U071 COVID-19: Secondary | ICD-10-CM | POA: Insufficient documentation

## 2020-04-12 NOTE — Patient Instructions (Signed)
Covid 19 Dehydration:   Stay well hydrated  Stay active  Deep breathing exercises  May take tylenol for fever or pain      Follow up:  Follow up if needed

## 2020-04-28 ENCOUNTER — Ambulatory Visit: Payer: Medicaid Other
